# Patient Record
Sex: Female | Born: 2000 | Race: Black or African American | Hispanic: No | Marital: Single
Health system: Southern US, Community
[De-identification: ages and names within clinical notes are randomized; demographics above are authoritative.]

---

## 2019-01-02 ENCOUNTER — Emergency Department (HOSPITAL_COMMUNITY)
Admission: EM | Admit: 2019-01-02 | Discharge: 2019-01-02 | Disposition: A | Discharge status: Home / Self Care | Payer: Medicaid Other | Attending: Emergency Medicine | Admitting: Emergency Medicine

## 2019-01-02 ENCOUNTER — Other Ambulatory Visit: Payer: Self-pay

## 2019-01-02 ENCOUNTER — Emergency Department (HOSPITAL_COMMUNITY): Payer: Medicaid Other

## 2019-01-02 ENCOUNTER — Encounter (HOSPITAL_COMMUNITY): Payer: Self-pay

## 2019-01-02 DIAGNOSIS — M25562 Pain in left knee: Secondary | ICD-10-CM | POA: Diagnosis not present

## 2019-01-02 DIAGNOSIS — M7918 Myalgia, other site: Secondary | ICD-10-CM | POA: Diagnosis present

## 2019-01-02 MED ORDER — METHOCARBAMOL 500 MG PO TABS: 500.0000 mg | ORAL_TABLET | Freq: Two times a day (BID) | ORAL | 0 refills | Status: AC

## 2019-01-02 MED ORDER — NAPROXEN 500 MG PO TABS
500.0000 mg | ORAL_TABLET | Freq: Once | ORAL | Status: AC
  Administered 2019-01-02: 19:00:00 500 mg via ORAL
  Filled 2019-01-02: qty 1

## 2019-01-02 MED ORDER — NAPROXEN 500 MG PO TABS: 500.0000 mg | ORAL_TABLET | Freq: Two times a day (BID) | ORAL | 0 refills | Status: AC
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
## 2019-01-02 NOTE — Discharge Instructions (Signed)
I have prescribed muscle relaxers for your pain, please do not drink or drive while taking this medications as they can make you drowsy.  Please follow-up with PCP in 1 week for reevaluation of your symptoms.  You experience any bowel or bladder incontinence, fever, worsening in your symptoms please return to the ED. ° °
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
